# Patient Record
Sex: Male | Born: 1980 | Race: Black or African American | Hispanic: No | Marital: Single | Smoking: Current every day smoker
Health system: Southern US, Community
[De-identification: ages and names within clinical notes are randomized; demographics above are authoritative.]

## PROBLEM LIST (undated history)

## (undated) DIAGNOSIS — A549 Gonococcal infection, unspecified: Secondary | ICD-10-CM

---

## 2011-04-01 ENCOUNTER — Encounter (HOSPITAL_COMMUNITY): Payer: Self-pay

## 2011-04-01 ENCOUNTER — Emergency Department (HOSPITAL_COMMUNITY)
Admission: EM | Admit: 2011-04-01 | Discharge: 2011-04-01 | Disposition: A | Discharge status: Home / Self Care | Payer: Self-pay | Attending: Emergency Medicine | Admitting: Emergency Medicine

## 2011-04-01 ENCOUNTER — Emergency Department (HOSPITAL_COMMUNITY): Payer: Self-pay

## 2011-04-01 DIAGNOSIS — N453 Epididymo-orchitis: Secondary | ICD-10-CM | POA: Insufficient documentation

## 2011-04-01 LAB — URINALYSIS, ROUTINE W REFLEX MICROSCOPIC
Bilirubin Urine: NEGATIVE
Protein, ur: NEGATIVE mg/dL
Urobilinogen, UA: 0.2 mg/dL (ref 0.0–1.0)

## 2011-04-01 LAB — URINE MICROSCOPIC-ADD ON

## 2011-04-01 MED ORDER — HYDROCODONE-ACETAMINOPHEN 5-325 MG PO TABS: 1.0000 | ORAL_TABLET | ORAL | Status: AC | PRN

## 2011-04-01 MED ORDER — CIPROFLOXACIN HCL 500 MG PO TABS: 500.0000 mg | ORAL_TABLET | Freq: Two times a day (BID) | ORAL | Status: AC

## 2011-04-01 MED ORDER — LIDOCAINE HCL (PF) 1 % IJ SOLN
INTRAMUSCULAR | Status: AC
  Filled 2011-04-01: qty 5

## 2011-04-01 MED ORDER — CIPROFLOXACIN HCL 500 MG PO TABS
500.0000 mg | ORAL_TABLET | Freq: Once | ORAL | Status: AC
  Administered 2011-04-01: 500 mg via ORAL
  Filled 2011-04-01: qty 1

## 2011-04-01 MED ORDER — CEFTRIAXONE SODIUM 250 MG IJ SOLR
250.0000 mg | Freq: Once | INTRAMUSCULAR | Status: AC
  Administered 2011-04-01: 250 mg via INTRAMUSCULAR
  Filled 2011-04-01: qty 250

## 2011-04-01 MED ORDER — HYDROCODONE-ACETAMINOPHEN 5-325 MG PO TABS
1.0000 | ORAL_TABLET | Freq: Once | ORAL | Status: AC
  Administered 2011-04-01: 1 via ORAL
  Filled 2011-04-01: qty 1

## 2011-04-01 MED ORDER — AZITHROMYCIN 1 G PO PACK
1.0000 g | PACK | Freq: Once | ORAL | Status: AC
  Administered 2011-04-01: 1 g via ORAL
  Filled 2011-04-01: qty 1

## 2011-04-01 NOTE — Discharge Instructions (Signed)
Epididymitis Epididymitis is a swelling (inflammation) of the epididymis. The epididymis is a cord-like structure along the back part of the testicle. Epididymitis is usually, but not always, caused by infection. This is usually a sudden problem beginning with chills, fever and pain behind the scrotum and in the testicle. There may be swelling and redness of the testicle. DIAGNOSIS  Physical examination will reveal a tender, swollen epididymis. Sometimes, cultures are obtained from the urine or from prostate secretions to help find out if there is an infection or if the cause is a different problem. Sometimes, blood work is performed to see if your white blood cell count is elevated and if a germ (bacterial) or viral infection is present. Using this knowledge, an appropriate medicine which kills germs (antibiotic) can be chosen by your caregiver. A viral infection causing epididymitis will most often go away (resolve) without treatment. HOME CARE INSTRUCTIONS   Hot sitz baths for 20 minutes, 4 times per day, may help relieve pain.   Only take over-the-counter or prescription medicines for pain, discomfort or fever as directed by your caregiver.   Take all medicines, including antibiotics, as directed. Take the antibiotics for the full prescribed length of time even if you are feeling better.   It is very important to keep all follow-up appointments.  SEEK IMMEDIATE MEDICAL CARE IF:   You have a fever.   You have pain not relieved with medicines.   You have any worsening of your problems.   Your pain seems to come and go.   You develop pain, redness, and swelling in the scrotum and surrounding areas.  MAKE SURE YOU:   Understand these instructions.   Will watch your condition.   Will get help right away if you are not doing well or get worse.  Document Released: 12/17/1999 Document Revised: 12/08/2010 Document Reviewed: 11/05/2008 [facility] Patient Information 2012 [place],  [place].Orchitis Orchitis is an infection of the testicle of usually sudden onset (happens quickly). It may be viral or bacterial (caused by germs). Usually with this illness there is generalized malaise (not feeling well) and fever. There is also pain. There is usually tenderness and swelling of the scrotum and testicle. DIAGNOSIS  Your caregiver will perform an exam to make sure there is not another reason for the pain in your testicle. A rectal exam may be done to find out if the prostate is swollen and tender. Blood work may be done to see if your white blood cell count is elevated. This can help determine if an infection is viral or bacterial. A urinalysis can also determine what type of infection is present. Most bacterial infections can be treated with antibiotics (medications which kill germs). LET YOUR CAREGIVER KNOW ABOUT:  Allergies.   Medications taken including herbs, eye drops, over the counter medications, and creams.   Use of steroids (by mouth or creams).   Previous problems with anesthetics or novocaine.   Previous prostate infections.   History of blood clots (thrombophlebitis).   History of bleeding or blood problems.   Previous surgery.   Previous urinary tract infection.   Other health problems.  HOME CARE INSTRUCTIONS   Apply cold packs to the scrotal area for twenty minutes, four times per day or as needed.   A scrotal support may be helpful. Keep a small pillow or support under your testicles while lying or sitting down.   Only take over-the-counter or prescription medicines for pain, discomfort, or fever as directed by your caregiver.  Take all medications, including antibiotics, as directed. Take the antibiotics for the full prescribed length of time even if you are feeling better.  SEEK IMMEDIATE MEDICAL CARE IF:   Your redness, swelling, or pain in the testicle increases or is not getting better.   You have a fever.   You have pain not relieved with  medicines.   You have any worsening of any symptoms (problems) that originally brought you in for medical care.  Document Released: 12/17/1999 Document Revised: 12/08/2010 Document Reviewed: 12/19/2004 [facility] Patient Information 2012 [place], [place].  Follow up with Dr. Margarita Grizzle with urology this coming week.

## 2011-04-01 NOTE — ED Provider Notes (Signed)
History     CSN: [id number]  Arrival date & time 04/01/11  1743   First MD Initiated Contact with Patient 04/01/11 2000      Chief Complaint  Patient presents with  . Testicle Pain    (Consider location/radiation/quality/duration/timing/severity/associated sxs/prior treatment) HPI Comments: Patient here with acute onset of left testicle pain and swelling - states this started about 3 days ago - has been gradually increasing since then.  Denies fever, chills, urethral discharge, penile pain, rectal pain, dysuria, hematuria.  Patient is a 31 y.o. male presenting with testicular pain. The history is provided by the patient. No language interpreter was used.  Testicle Pain This is a new problem. The current episode started in the past 7 days. The problem occurs constantly. The problem has been gradually worsening. Associated symptoms include swollen glands. Pertinent negatives include no abdominal pain, anorexia, arthralgias, change in bowel habit, chest pain, chills, congestion, coughing, diaphoresis, fatigue, fever, headaches, joint swelling, myalgias, nausea, neck pain, numbness, rash, sore throat, urinary symptoms, vertigo, visual change, vomiting or weakness. The symptoms are aggravated by nothing. He has tried nothing for the symptoms. The treatment provided no relief.    History reviewed. No pertinent past medical history.  History reviewed. No pertinent past surgical history.  History reviewed. No pertinent family history.  History  Substance Use Topics  . Smoking status: Current Everyday Smoker  . Smokeless tobacco: Not on file  . Alcohol Use: Yes      Review of Systems  Constitutional: Negative for fever, chills, diaphoresis and fatigue.  HENT: Negative for congestion, sore throat and neck pain.   Respiratory: Negative for cough.   Cardiovascular: Negative for chest pain.  Gastrointestinal: Negative for nausea, vomiting, abdominal pain, anorexia and change in bowel  habit.  Genitourinary: Positive for testicular pain.  Musculoskeletal: Negative for myalgias, joint swelling and arthralgias.  Skin: Negative for rash.  Neurological: Negative for vertigo, weakness, numbness and headaches.  All other systems reviewed and are negative.    Allergies  Review of patient's allergies indicates no known allergies.  Home Medications   Current Outpatient Rx  Name Route Sig Dispense Refill  . ASPIRIN EC 325 MG PO TBEC Oral Take 325 mg by mouth daily as needed. For pain    . RANITIDINE HCL 150 MG PO TABS Oral Take 150 mg by mouth daily as needed. For heartburn      BP 127/71  Pulse 67  Temp(Src) 99.3 F (37.4 C) (Oral)  Resp 22  SpO2 97%  Physical Exam  Nursing note and vitals reviewed. Constitutional: He is oriented to person, place, and time. He appears well-developed and well-nourished. He appears distressed.  HENT:  Head: Normocephalic and atraumatic.  Right Ear: External ear normal.  Left Ear: External ear normal.  Nose: Nose normal.  Mouth/Throat: Oropharynx is clear and moist. No oropharyngeal exudate.  Eyes: Conjunctivae are normal. Pupils are equal, round, and reactive to light. No scleral icterus.  Neck: Normal range of motion. Neck supple.  Cardiovascular: Normal rate, regular rhythm and normal heart sounds.  Exam reveals no gallop and no friction rub.   No murmur heard. Pulmonary/Chest: Effort normal and breath sounds normal. No respiratory distress. He has no wheezes. He has no rales. He exhibits no tenderness.  Abdominal: Soft. Bowel sounds are normal. He exhibits no distension and no mass. There is no tenderness. There is no rebound and no guarding. Hernia confirmed negative in the right inguinal area and confirmed negative in the left inguinal  area.  Genitourinary: Penis normal. Right testis shows no mass, no swelling and no tenderness. Cremasteric reflex is not absent on the right side. Left testis shows swelling and tenderness. Left  testis shows no mass. Cremasteric reflex is not absent on the left side. Circumcised. No penile erythema or penile tenderness. No discharge found.  Musculoskeletal: Normal range of motion. He exhibits no edema and no tenderness.  Lymphadenopathy:    He has no cervical adenopathy.       Right: No inguinal adenopathy present.       Left: Inguinal adenopathy present.  Neurological: He is alert and oriented to person, place, and time. No cranial nerve deficit.  Skin: Skin is warm and dry. No rash noted. No erythema. No pallor.  Psychiatric: He has a normal mood and affect. His behavior is normal. Judgment and thought content normal.    ED Course  Procedures (including critical care time)  Labs Reviewed  URINALYSIS, ROUTINE W REFLEX MICROSCOPIC - Abnormal; Notable for the following:    Hgb urine dipstick TRACE (*)    Leukocytes, UA MODERATE (*)    All other components within normal limits  URINE MICROSCOPIC-ADD ON - Abnormal; Notable for the following:    Squamous Epithelial / LPF FEW (*)    All other components within normal limits  GC/CHLAMYDIA PROBE AMP, GENITAL   US Scrotum  04/01/2011  *RADIOLOGY REPORT*  Clinical Data:  Left testicular pain, swelling.  SCROTAL ULTRASOUND DOPPLER ULTRASOUND OF THE TESTICLES  Technique: Complete ultrasound examination of the testicles, epididymis, and other scrotal structures was performed.  Color and spectral Doppler ultrasound were also utilized to evaluate blood flow to the testicles.  Comparison:  None.  Findings:  Right testis:  4.3 x 2.4 x 2.4 cm. Marked diffuse echogenic foci throughout the testicle compatible with severe microlithiasis. Small cystic areas within the right testicle.  Normal arterial and venous blood flow.  Left testis:  4.1 x 2.9 x 3.3 cm.  Marked diffuse echogenic foci compatible with severe microlithiasis.  Small scattered cystic areas.  Blood flow to the left testicle slightly increased compared to the right suggesting orchitis.   Arterial and venous blood flow documented.  Right epididymis:  Normal in size and appearance.  Left epididymis:  Increase in blood flow to the left epididymis suggesting epididymitis.  Hydocele:  Moderate left  Varicocele:  Small left  Pulsed Doppler interrogation of both testes demonstrates low resistance flow bilaterally.  IMPRESSION: Increased blood flow to the left testicle and epididymis compatible with epididymo-orchitis.  Moderate left hydrocele and small left varicocele.  The severe diffuse bilateral microlithiasis.  This can be associated with increased incidence of testicular cancer.  Consider urologic consultation and sonographic surveillance.  Original Report Authenticated By: Cyndie Chime, M.D.     Left epididymo-orchitis    MDM  Patient without torsion or testicular cancer.  US shows infection but also notes diffuse bilateral microlithiasis and recommends urology follow up which I have discussed with the patient.  Have treated the infection and he will follow up with Dr. Margarita Grizzle.        Noah Edwards [place], [place] 04/01/11 2158

## 2011-04-01 NOTE — ED Notes (Signed)
Lt,. Testicle is swollen for the last 3 days and the pain radiates into his lt. Groin.  Pt. Denies any penile drainage

## 2011-04-03 LAB — GC/CHLAMYDIA PROBE AMP, GENITAL: Chlamydia, DNA Probe: NEGATIVE

## 2011-04-09 NOTE — ED Provider Notes (Signed)
Evaluation and management procedures were performed by the PA/NP/Resident Physician under my supervision/collaboration.   Felisa Bonier, MD 04/09/11 [id number]

## 2011-04-17 ENCOUNTER — Encounter (HOSPITAL_COMMUNITY): Payer: Self-pay

## 2011-04-17 ENCOUNTER — Emergency Department (HOSPITAL_COMMUNITY)
Admission: EM | Admit: 2011-04-17 | Discharge: 2011-04-17 | Disposition: A | Discharge status: Home / Self Care | Payer: Self-pay | Attending: Emergency Medicine | Admitting: Emergency Medicine

## 2011-04-17 DIAGNOSIS — F17200 Nicotine dependence, unspecified, uncomplicated: Secondary | ICD-10-CM | POA: Insufficient documentation

## 2011-04-17 DIAGNOSIS — N50819 Testicular pain, unspecified: Secondary | ICD-10-CM

## 2011-04-17 DIAGNOSIS — N509 Disorder of male genital organs, unspecified: Secondary | ICD-10-CM | POA: Insufficient documentation

## 2011-04-17 MED ORDER — OXYCODONE-ACETAMINOPHEN 5-325 MG PO TABS
2.0000 | ORAL_TABLET | Freq: Once | ORAL | Status: AC
  Administered 2011-04-17: 2 via ORAL
  Filled 2011-04-17: qty 2

## 2011-04-17 MED ORDER — OXYCODONE-ACETAMINOPHEN 5-325 MG PO TABS: 1.0000 | ORAL_TABLET | ORAL | Status: AC | PRN

## 2011-04-17 MED ORDER — DOXYCYCLINE HYCLATE 100 MG PO CAPS: 100.0000 mg | ORAL_CAPSULE | Freq: Two times a day (BID) | ORAL | Status: AC

## 2011-04-17 NOTE — Discharge Instructions (Signed)
PLEASE FOLLOWUP WITH UROLOGY IN ONE WEEK AFTER FINISHING YOUR ANTIBIOTICS AS WE DISCUSSED

## 2011-04-17 NOTE — ED Provider Notes (Signed)
History     CSN: [id number]  Arrival date & time 04/17/11  4098   First MD Initiated Contact with Patient 04/17/11 1104      Chief Complaint  Patient presents with  . Groin Pain     Patient is a 31 y.o. male presenting with groin pain. The history is provided by the patient.  Groin Pain This is a recurrent problem. The current episode started more than 1 week ago. The problem occurs constantly. The problem has not changed since onset.Pertinent negatives include no abdominal pain. The symptoms are aggravated by walking. The symptoms are relieved by rest. Treatments tried: antibiotics. The treatment provided mild relief.  pt reports treated for infection last month, reports some improvement in swelling but still has pain Has not followed up as of yet He has finished doxycycline (10 days) He also reports IM antbiotics at that time No fever/vomiting reported No abd pain  no trauma to his groin  PMH - none  History reviewed. No pertinent past surgical history.  No family history on file.  History  Substance Use Topics  . Smoking status: Current Everyday Smoker  . Smokeless tobacco: Not on file  . Alcohol Use: Yes      Review of Systems  Constitutional: Negative for fever.  Gastrointestinal: Negative for abdominal pain.    Allergies  Review of patient's allergies indicates no known allergies.  Home Medications   Current Outpatient Rx  Name Route Sig Dispense Refill  . HYDROCODONE-ACETAMINOPHEN 5-500 MG PO CAPS Oral Take 1 capsule by mouth every 6 (six) hours as needed. For pain    . DOXYCYCLINE HYCLATE 100 MG PO CAPS Oral Take 1 capsule (100 mg total) by mouth 2 (two) times daily. 14 capsule 0  . OXYCODONE-ACETAMINOPHEN 5-325 MG PO TABS Oral Take 1 tablet by mouth every 4 (four) hours as needed for pain. 10 tablet 0    BP 142/83  Pulse 64  Temp(Src) 98 F (36.7 C) (Oral)  Resp 18  SpO2 99%  Physical Exam CONSTITUTIONAL: Well developed/well nourished HEAD  AND FACE: Normocephalic/atraumatic EYES: EOMI/PERRL ENMT: Mucous membranes moist NECK: supple no meningeal signs SPINE:entire spine nontender CV: S1/S2 noted, no murmurs/rubs/gallops noted LUNGS: Lungs are clear to auscultation bilaterally, no apparent distress ABDOMEN: soft, nontender, no rebound or guarding GU:no cva tenderness.  Left testicle tenderness and mild swelling noted but no erythema/crepitance noted.  Right testicle is nontender.  No hernia noted.  Chaperone present NEURO: Pt is awake/alert, moves all extremitiesx4 EXTREMITIES: pulses normal, full ROM SKIN: warm, color normal PSYCH: no abnormalities of mood noted  ED Course  Procedures    1. Testicle pain       MDM  Nursing notes reviewed and considered in documentation Previous records reviewed and considered   Pt seen at end of march, had [place] that epidimytis, finished 10 days of doxy but still with pain, but improvement swelling On exam, suspect continued infection doubt torsion Will give another 7 days of doxy and advised him of need for urology followup as instructed by scrotal US results  The patient appears reasonably screened and/or stabilized for discharge and I doubt any other medical condition or other [facility] requiring further screening, evaluation, or treatment in the ED at this time prior to discharge.         Joya Gaskins, MD 04/17/11 1248

## 2011-04-17 NOTE — ED Notes (Signed)
Patient present with left groin pain x 3 weeks. Patient was seen a week ago for the same. Patient was tested for STD and never heard back.  Patient reporting continuous pain since. Patient denies flank pain, penile discharge and urinary symptoms

## 2012-12-17 ENCOUNTER — Other Ambulatory Visit: Payer: Self-pay

## 2012-12-23 ENCOUNTER — Inpatient Hospital Stay: Admission: RE | Admit: 2012-12-23 | Payer: Self-pay | Source: Ambulatory Visit

## 2013-02-09 ENCOUNTER — Emergency Department (HOSPITAL_COMMUNITY)
Admission: EM | Admit: 2013-02-09 | Discharge: 2013-02-09 | Disposition: A | Discharge status: Home / Self Care | Payer: Self-pay | Attending: Emergency Medicine | Admitting: Emergency Medicine

## 2013-02-09 ENCOUNTER — Encounter (HOSPITAL_COMMUNITY): Payer: Self-pay | Admitting: Emergency Medicine

## 2013-02-09 ENCOUNTER — Telehealth (HOSPITAL_COMMUNITY): Payer: Self-pay | Admitting: *Deleted

## 2013-02-09 ENCOUNTER — Emergency Department (HOSPITAL_COMMUNITY): Payer: Self-pay

## 2013-02-09 DIAGNOSIS — N451 Epididymitis: Secondary | ICD-10-CM

## 2013-02-09 DIAGNOSIS — F17200 Nicotine dependence, unspecified, uncomplicated: Secondary | ICD-10-CM | POA: Insufficient documentation

## 2013-02-09 DIAGNOSIS — N453 Epididymo-orchitis: Secondary | ICD-10-CM | POA: Insufficient documentation

## 2013-02-09 DIAGNOSIS — N509 Disorder of male genital organs, unspecified: Secondary | ICD-10-CM | POA: Insufficient documentation

## 2013-02-09 DIAGNOSIS — N6320 Unspecified lump in the left breast, unspecified quadrant: Secondary | ICD-10-CM

## 2013-02-09 DIAGNOSIS — A5400 Gonococcal infection of lower genitourinary tract, unspecified: Secondary | ICD-10-CM | POA: Insufficient documentation

## 2013-02-09 DIAGNOSIS — R369 Urethral discharge, unspecified: Secondary | ICD-10-CM | POA: Insufficient documentation

## 2013-02-09 DIAGNOSIS — N63 Unspecified lump in unspecified breast: Secondary | ICD-10-CM | POA: Insufficient documentation

## 2013-02-09 HISTORY — DX: Gonococcal infection, unspecified: A54.9

## 2013-02-09 LAB — URINE MICROSCOPIC-ADD ON

## 2013-02-09 LAB — URINALYSIS, ROUTINE W REFLEX MICROSCOPIC
BILIRUBIN URINE: NEGATIVE
GLUCOSE, UA: NEGATIVE mg/dL
Ketones, ur: NEGATIVE mg/dL
Nitrite: NEGATIVE
PROTEIN: NEGATIVE mg/dL
Specific Gravity, Urine: 1.015 (ref 1.005–1.030)
Urobilinogen, UA: 1 mg/dL (ref 0.0–1.0)
pH: 6 (ref 5.0–8.0)

## 2013-02-09 MED ORDER — CEFTRIAXONE SODIUM 250 MG IJ SOLR
250.0000 mg | Freq: Once | INTRAMUSCULAR | Status: AC
  Administered 2013-02-09: 250 mg via INTRAMUSCULAR
  Filled 2013-02-09: qty 250

## 2013-02-09 MED ORDER — HYDROCODONE-ACETAMINOPHEN 5-325 MG PO TABS
1.0000 | ORAL_TABLET | Freq: Once | ORAL | Status: AC
  Administered 2013-02-09: 1 via ORAL
  Filled 2013-02-09: qty 1

## 2013-02-09 MED ORDER — AZITHROMYCIN 250 MG PO TABS
1000.0000 mg | ORAL_TABLET | Freq: Once | ORAL | Status: AC
  Administered 2013-02-09: 1000 mg via ORAL
  Filled 2013-02-09: qty 4

## 2013-02-09 MED ORDER — DOXYCYCLINE HYCLATE 100 MG PO TABS
100.0000 mg | ORAL_TABLET | Freq: Once | ORAL | Status: AC
  Administered 2013-02-09: 100 mg via ORAL
  Filled 2013-02-09: qty 1

## 2013-02-09 MED ORDER — HYDROCODONE-ACETAMINOPHEN 5-325 MG PO TABS: 1.0000 | ORAL_TABLET | Freq: Four times a day (QID) | ORAL | Status: AC | PRN

## 2013-02-09 MED ORDER — DOXYCYCLINE HYCLATE 100 MG PO CAPS: 100.0000 mg | ORAL_CAPSULE | Freq: Two times a day (BID) | ORAL | Status: AC

## 2013-02-09 MED ORDER — LIDOCAINE HCL (PF) 1 % IJ SOLN
INTRAMUSCULAR | Status: AC
  Administered 2013-02-09: 5 mL
  Filled 2013-02-09: qty 5

## 2013-02-09 MED ORDER — NAPROXEN 500 MG PO TABS: 500.0000 mg | ORAL_TABLET | Freq: Two times a day (BID) | ORAL | Status: AC

## 2013-02-09 NOTE — ED Notes (Signed)
Pt called to get me to call rxs into [facility] on [address],  Called in Doxycycline and Naproxen, pt instructed he would have to go back through the whole process since he lost his rxs on the bus.

## 2013-02-09 NOTE — ED Notes (Signed)
Pt presents to department for evaluation of abdominal pain, R testicular pain and green/yellow penile discharge. Ongoing x1 week. Pt is alert and oriented x4. No signs of acute distress noted.

## 2013-02-09 NOTE — Discharge Instructions (Signed)
Take naprosyn for pain and inflammation. norco for severe pain only. Take antibiotics as prescribed until all gone. Follow up with urology.     Epididymitis Epididymitis is a swelling (inflammation) of the epididymis. The epididymis is a cord-like structure along the back part of the testicle. Epididymitis is usually, but not always, caused by infection. This is usually a sudden problem beginning with chills, fever and pain behind the scrotum and in the testicle. There may be swelling and redness of the testicle. DIAGNOSIS  Physical examination will reveal a tender, swollen epididymis. Sometimes, cultures are obtained from the urine or from prostate secretions to help find out if there is an infection or if the cause is a different problem. Sometimes, blood work is performed to see if your white blood cell count is elevated and if a germ (bacterial) or viral infection is present. Using this knowledge, an appropriate medicine which kills germs (antibiotic) can be chosen by your caregiver. A viral infection causing epididymitis will most often go away (resolve) without treatment. HOME CARE INSTRUCTIONS   Hot sitz baths for 20 minutes, 4 times per day, may help relieve pain.  Only take over-the-counter or prescription medicines for pain, discomfort or fever as directed by your caregiver.  Take all medicines, including antibiotics, as directed. Take the antibiotics for the full prescribed length of time even if you are feeling better.  It is very important to keep all follow-up appointments. SEEK IMMEDIATE MEDICAL CARE IF:   You have a fever.  You have pain not relieved with medicines.  You have any worsening of your problems.  Your pain seems to come and go.  You develop pain, redness, and swelling in the scrotum and surrounding areas. MAKE SURE YOU:   Understand these instructions.  Will watch your condition.  Will get help right away if you are not doing well or get worse. Document  Released: 12/17/1999 Document Revised: 03/13/2011 Document Reviewed: 11/05/2008 [facility] Patient Information 2014 [place], [place].

## 2013-02-09 NOTE — ED Notes (Signed)
Patient transported to Ultrasound 

## 2013-02-09 NOTE — ED Provider Notes (Signed)
CSN: [id number]     Arrival date & time 02/09/13  1206 History  This chart was scribed for Noah Crumble, PA, working with Noah Kras, MD, by Noah Edwards, ED Scribe. This patient was seen in room TR10C/TR10C and the patient's care was started at 1:22 PM.   Chief Complaint  Patient presents with  . Abdominal Pain  . Testicle Pain  . Penile Discharge   The history is provided by the patient. No language interpreter was used.    HPI Comments: Noah Edwards is a 33 y.o. male who presents to the Emergency Department complaining of testicular pain with onset 2 days ago. Patient reports some swelling to one testicle and is having associated pain in his scrotum. He denies dysuria; however, reports a mild burning sensation while urinating. Patient reports that one week ago, he noticed penile discharge of a milky color. Patient had a similar episode 15 years ago and was dx with ghonorrhia. Patient denies any recent sexual activity. Patient denies having a fever.   Patient also reports having pain in his L breast and noticing a small swelling near his L nipple, with onset last December. He reports the sensation began as a tingling sensation and has progressed to a mild pain, particularly with touch. Patient denies having similar symptoms in the past.   Past Medical History  Diagnosis Date  . Gonorrhea    History reviewed. No pertinent past surgical history. History reviewed. No pertinent family history. History  Substance Use Topics  . Smoking status: Current Every Day Smoker    Types: Cigarettes  . Smokeless tobacco: Not on file  . Alcohol Use: Yes    Review of Systems  Constitutional: Negative for fever and chills.  HENT: Negative for congestion, sore throat and trouble swallowing.   Respiratory: Negative for cough and shortness of breath.   Gastrointestinal: Negative for nausea, vomiting and diarrhea.  Genitourinary: Positive for discharge, scrotal swelling and testicular pain.  Negative for dysuria and hematuria.  Skin:       Small and mildly painful swelling to L breast.  Neurological: Negative for weakness and headaches.  All other systems reviewed and are negative.    Allergies  Review of patient's allergies indicates no known allergies.  Home Medications   Current Outpatient Rx  Name  Route  Sig  Dispense  Refill  . hydrocodone-acetaminophen (LORCET-HD) 5-500 MG per capsule   Oral   Take 1 capsule by mouth every 6 (six) hours as needed. For pain          Triage Vitals: BP 120/71  Pulse 75  Temp(Src) 97.8 F (36.6 C) (Oral)  Resp 20  Ht 5\' 11"  (1.803 m)  Wt 160 lb (72.576 kg)  BMI 22.33 kg/m2  SpO2 100%  Physical Exam  Nursing note and vitals reviewed. Constitutional: He is oriented to person, place, and time. He appears well-developed and well-nourished. No distress.  HENT:  Head: Normocephalic and atraumatic.  Eyes: EOM are normal.  Neck: Neck supple. No tracheal deviation present.  Cardiovascular: Normal rate.   Pulmonary/Chest: Effort normal. No respiratory distress.  Genitourinary:  Right testicle enlarged. Tender to palpation. Normal cremasteric reflex. Penis normal  Musculoskeletal: Normal range of motion.  Neurological: He is alert and oriented to person, place, and time.  Skin: Skin is warm and dry.  1 cm soft, mass directly underneath the L nipple. TTP. No nipple discharge.  Psychiatric: He has a normal mood and affect. His behavior is normal.  ED Course  Procedures (including critical care time)  DIAGNOSTIC STUDIES: Oxygen Saturation is 100% on room air, normal by my interpretation.    COORDINATION OF CARE: 1:25 PM-UTS of scrotum and swab for ghonorrhia ordered. Discussed my suspcision of a mammary gland in L breast as cause of symptoms. Referred to breast center to get an UTS.Treatment plan discussed with patient and patient agrees.  Labs Review Labs Reviewed  URINALYSIS, ROUTINE W REFLEX MICROSCOPIC - Abnormal;  Notable for the following:    Hgb urine dipstick SMALL (*)    Leukocytes, UA MODERATE (*)    All other components within normal limits  URINE MICROSCOPIC-ADD ON - Abnormal; Notable for the following:    Bacteria, UA FEW (*)    All other components within normal limits  URINE CULTURE  GC/CHLAMYDIA PROBE AMP   Imaging Review [place] Scrotum  02/09/2013   CLINICAL DATA:  Testicular pain.  EXAM: SCROTAL ULTRASOUND  DOPPLER ULTRASOUND OF THE TESTICLES  TECHNIQUE: Complete ultrasound examination of the testicles, epididymis, and other scrotal structures was performed. Color and spectral Doppler ultrasound were also utilized to evaluate blood flow to the testicles.  COMPARISON:  04/01/2011  FINDINGS: Right testicle  Measurements: 4.5 x 2.4 x 3.1 cm. No mass. Normal arterial and venous blood flow documented. Extensive calcifications, similar to prior study.  Left testicle  Measurements: 4.4 x 2.9 x 3.0 cm. No mass. Normal arterial venous blood flow documented. Extensive microcalcifications, similar to prior study.  Right epididymis: Enlarged with increased blood flow compatible with epididymitis.  Left epididymis:  Normal in size and appearance.  Hydrocele:  Small bilateral  Varicocele:  None visualized.  Pulsed Doppler interrogation of both testes demonstrates low resistance arterial and venous waveforms bilaterally.  IMPRESSION: Extensive bilateral microlithiasis, unchanged since prior study.  Enlarged, hyperemic right epididymis compatible with epididymitis.  Small bilateral hydroceles.   Electronically Signed   By: Charlett NoseKevin  Dover M.D.   On: 02/09/2013 16:21   [place] Art/ven Flow Abd Pelv Doppler  02/09/2013   CLINICAL DATA:  Testicular pain.  EXAM: SCROTAL ULTRASOUND  DOPPLER ULTRASOUND OF THE TESTICLES  TECHNIQUE: Complete ultrasound examination of the testicles, epididymis, and other scrotal structures was performed. Color and spectral Doppler ultrasound were also utilized to evaluate blood flow to the testicles.   COMPARISON:  04/01/2011  FINDINGS: Right testicle  Measurements: 4.5 x 2.4 x 3.1 cm. No mass. Normal arterial and venous blood flow documented. Extensive calcifications, similar to prior study.  Left testicle  Measurements: 4.4 x 2.9 x 3.0 cm. No mass. Normal arterial venous blood flow documented. Extensive microcalcifications, similar to prior study.  Right epididymis: Enlarged with increased blood flow compatible with epididymitis.  Left epididymis:  Normal in size and appearance.  Hydrocele:  Small bilateral  Varicocele:  None visualized.  Pulsed Doppler interrogation of both testes demonstrates low resistance arterial and venous waveforms bilaterally.  IMPRESSION: Extensive bilateral microlithiasis, unchanged since prior study.  Enlarged, hyperemic right epididymis compatible with epididymitis.  Small bilateral hydroceles.   Electronically Signed   By: Charlett NoseKevin  Dover M.D.   On: 02/09/2013 16:21    EKG Interpretation   None       MDM   1. Epididymitis, right   2. Breast mass, left  Pt is in emergency department with penile discharge and right scrotal pain and swelling. Patient treated in emergency department and Rocephin Zithromax. His GC and Chlamydia culture sent. I did get an ultrasound of her testes which showed unchanged bilateral microlithiasis and right  epididymitis. Patient denies any history of anal intercourse. Patient will be discharged home with doxycycline for 10 days. He is instructed to make sure he takes to all the medication. Norco for pain. He is also instructed to followup with the breast Center or primary care doctor regarding his breast mass. This time I do not think this is cancer is given it is tender, mobile, soft.   Filed Vitals:   02/09/13 1207 02/09/13 1430 02/09/13 1714  BP: 120/71 104/68 113/66  Pulse: 75 67 77  Temp: 97.8 F (36.6 C)    TempSrc: Oral    Resp: 20 18 20   Height: 5\' 11"  (1.803 m)    Weight: 160 lb (72.576 kg)    SpO2: 100% 100% 100%    I  personally performed the services described in this documentation, which was scribed in my presence. The recorded information has been reviewed and is accurate.    Lottie Mussel, PA-C 02/09/13 1732

## 2013-02-10 LAB — GC/CHLAMYDIA PROBE AMP
CT Probe RNA: NEGATIVE
GC PROBE AMP APTIMA: NEGATIVE

## 2013-02-10 LAB — URINE CULTURE

## 2013-02-10 NOTE — Progress Notes (Signed)
Incoming call from Patient.He reports [facility] and was prescribed Doxycycline.Patient reports he has no health Insurance and requests help . This CM explained her role and options avaliable to patient.MATCH program explained.Patient educated this is once a year help ( up to $400 dollars) and that patient would need to Make a $3 dollar co-pay and use one of the [facility].Patient is aware Pain medication is not covered by the  [facility] .Patient  Was  Educated on Good Rx.This CM explained another option for the patient would be a Coupon-Per Good Rx his medication today would cost $17 .Patient elects to use his once a year  [facility].Patient reports he will return to ED to collect his MATCH letter from the main check in desk in ED.Patient receptive to CM help today.No further CM  Needs Identified.

## 2013-02-10 NOTE — Progress Notes (Signed)
Case Manager attempted to call patient at home he requested Medication assistance for his Doxycycline.Voicemail option not set up.Will attempt to call patient again this morning.

## 2013-02-10 NOTE — Progress Notes (Signed)
ED Care Management Note Date initiated:         Documentation initiated by:  ...        Subjective/Objective Assessment:  MATCH MEDICATION ASSISTANCE.             Subjective/Objective Assessment Detail:             Action/Plan:             Action/Plan Detail:  Incoming call from patient.Demographics verified in EPIC.  ED service date 02/09/13             Anticipated DC Date:         Status Recommendation to Physician:  ...      Result of Recommendation:  ...       Other ED Services:    Marland Kitchen...               In-house referral:    ...             DC Planning Services:    Marland Kitchen...              PAC Choice:    ...             Choice offered to / List presented to:  ...       DME arranged:    ...             DME agency:    ...              HH arranged:    ...             HH agency:    ...              Status of service:  ...        ED Comments:  MATCH.          ED Comment Detail:  CM received incoming call from patient regarding Medication help.Patient is uninsured.( see EPIC note)  Patient educated on Good RX and MATCH program.[place] explained.Patient aware this assistance  Is only once yearly and patient aware he has a Geneticist, molecular$3 dollar co-pay.Patient has not used the program before  And is aware Pain Medications are not covered per [facility] Guidelines.Teach back method used to ensure  Patient understanding of todays plan.

## 2013-02-11 NOTE — ED Provider Notes (Signed)
Medical screening examination/treatment/procedure(s) were performed by non-physician practitioner and as supervising physician I was immediately available for consultation/collaboration.    Rosabella Edgin R Giabella Duhart, MD 02/11/13 1110 

## 2015-01-31 IMAGING — US US ART/VEN ABD/PELV/SCROTUM DOPPLER LTD
1 series · 14 of 25 positions shown · non-contrast
Comparison: 04/01/2011

CLINICAL DATA: Testicular pain.

EXAM:
SCROTAL ULTRASOUND
DOPPLER ULTRASOUND OF THE TESTICLES
TECHNIQUE: Complete ultrasound examination of the testicles, epididymis, and
other scrotal structures was performed. Color and spectral Doppler
ultrasound were also utilized to evaluate blood flow to the
testicles.

[Series 1: us art/ven abd/pelv/scrotum doppler ltd · 0.07mm/px · 14 of 36 slices shown]
[im 1/36]
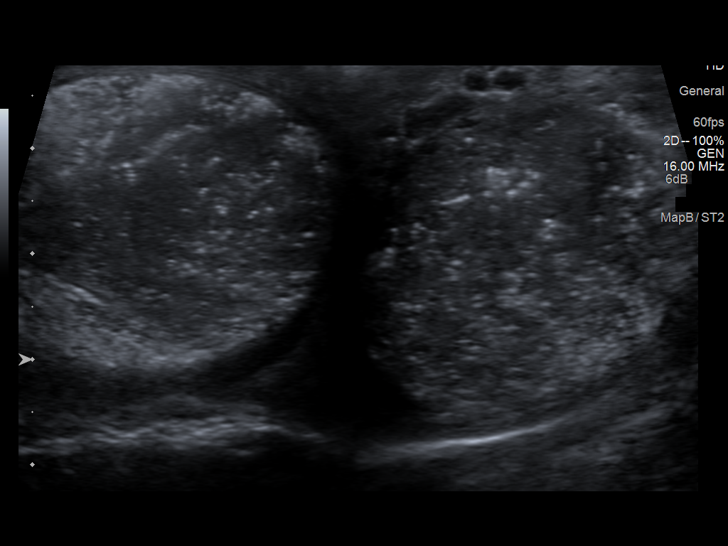
[im 3/36]
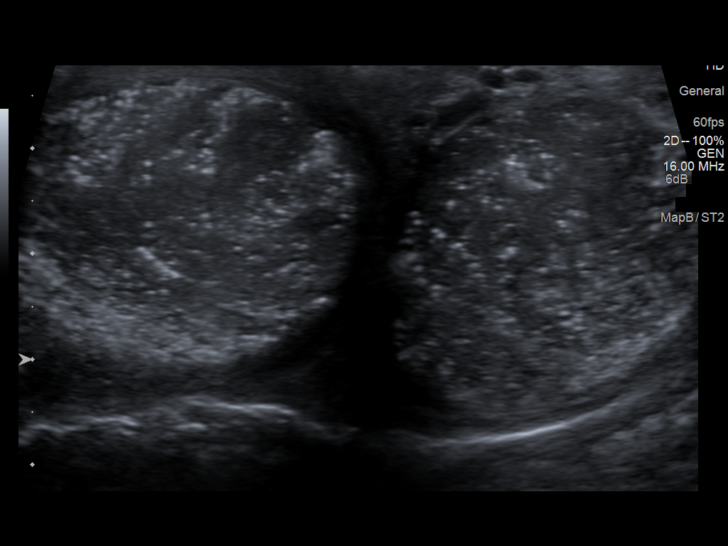
[im 6/36]
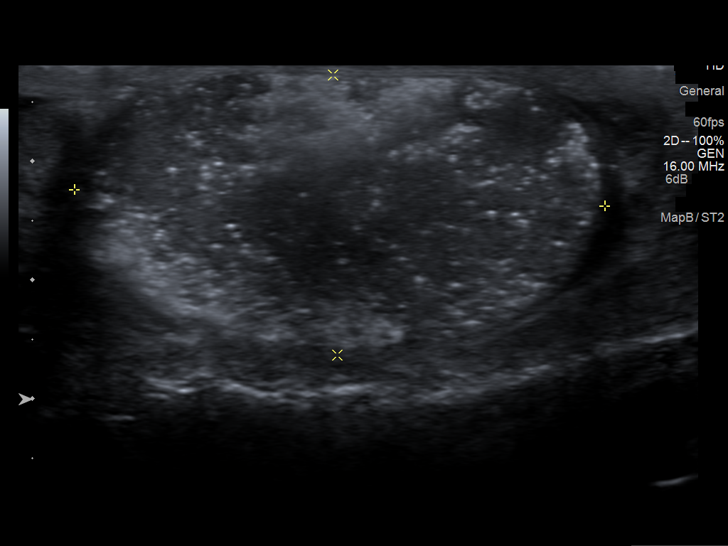
[im 9/36]
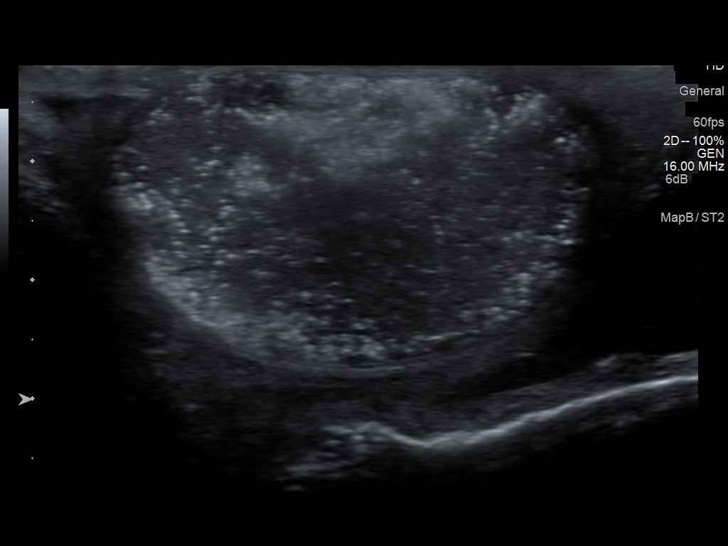
[im 12/36]
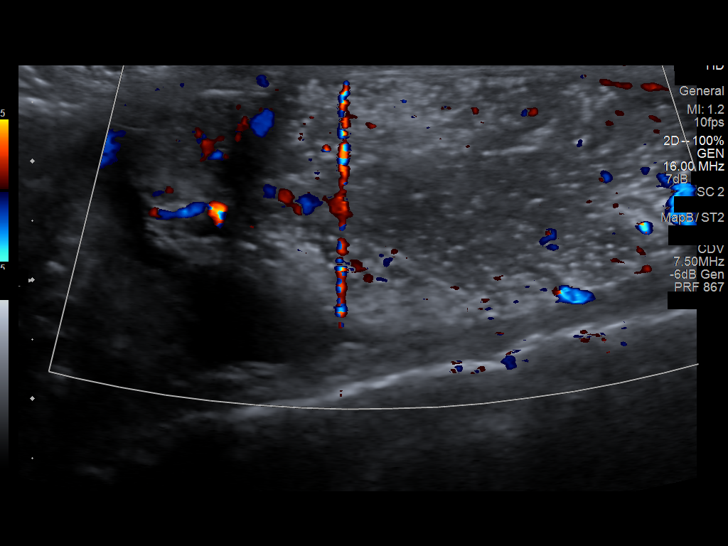
[im 14/36]
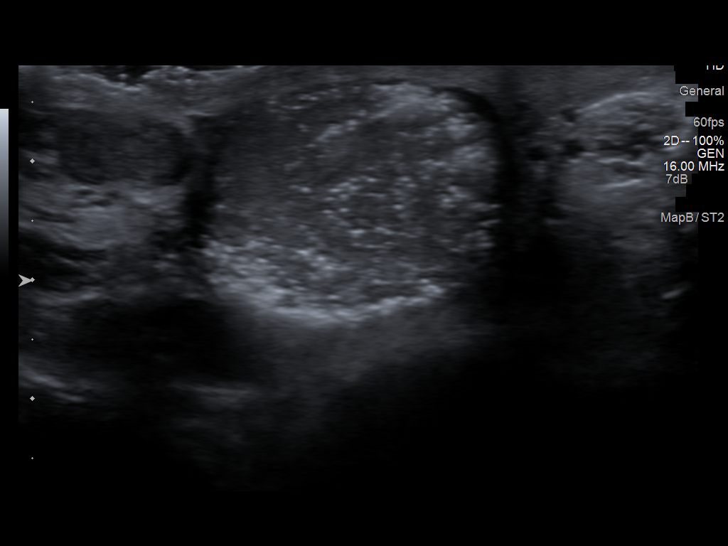
[im 17/36]
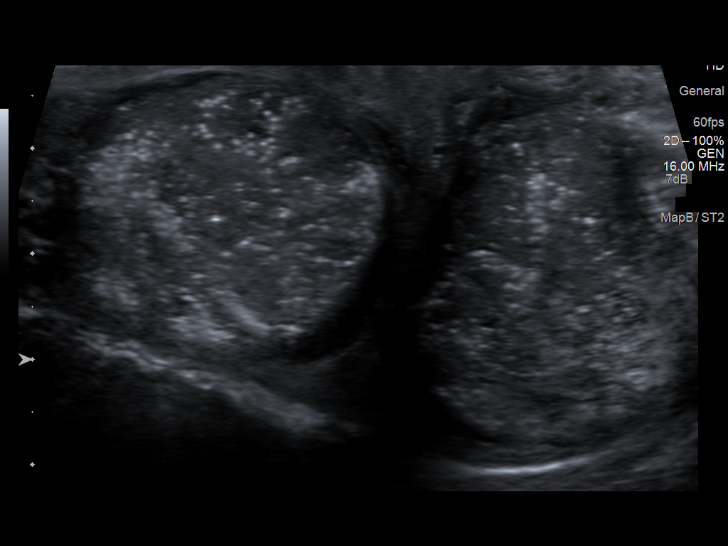
[im 19/36]
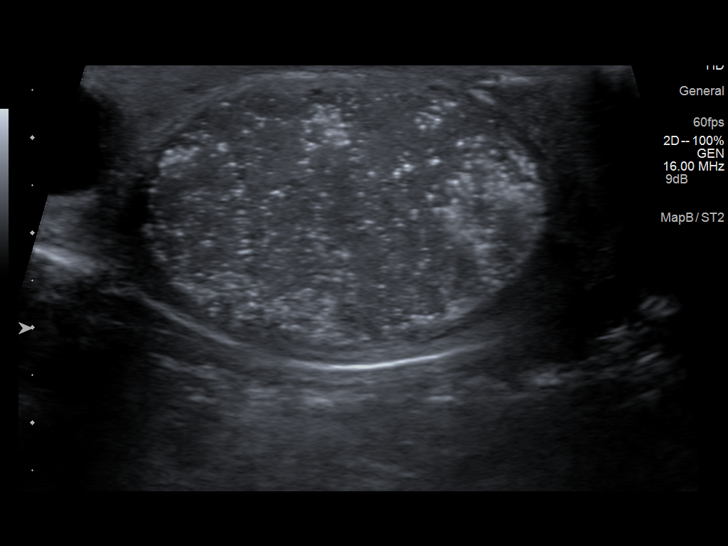
[im 22/36]
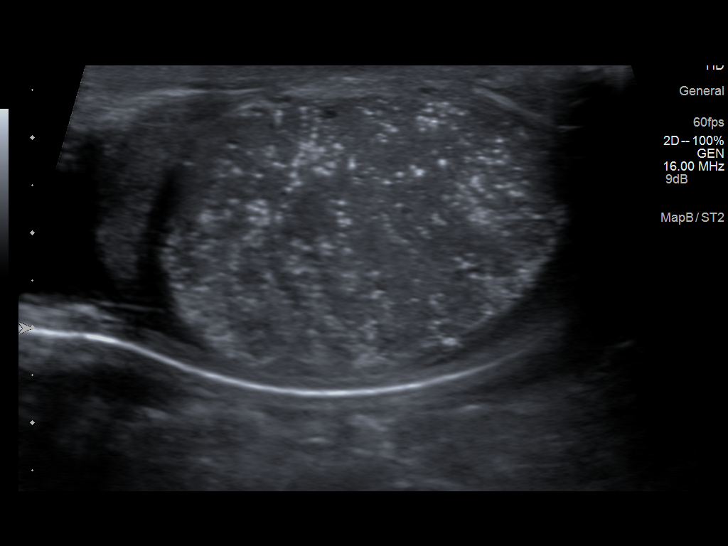
[im 24/36]
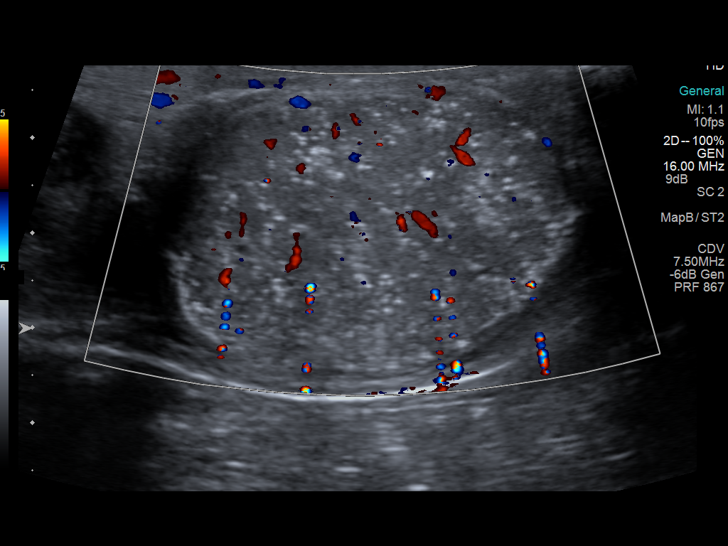
[im 27/36]
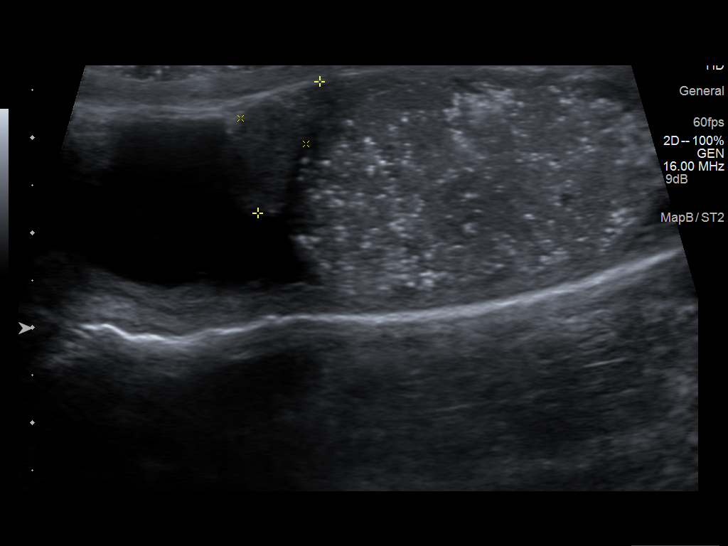
[im 30/36]
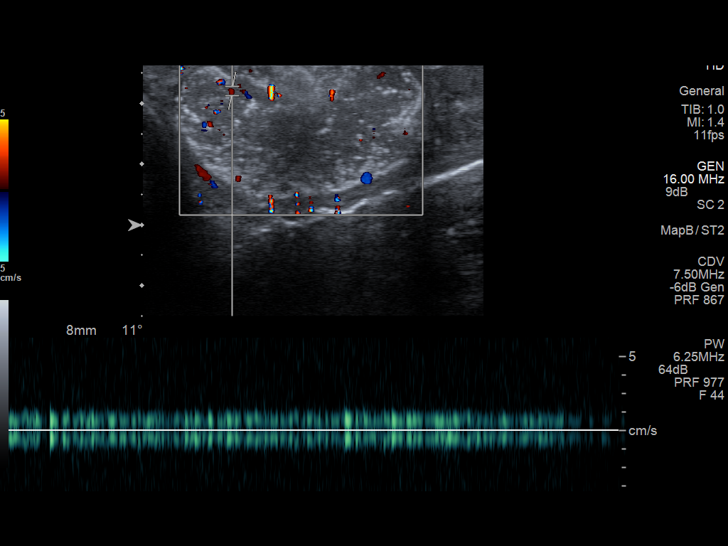
[im 33/36]
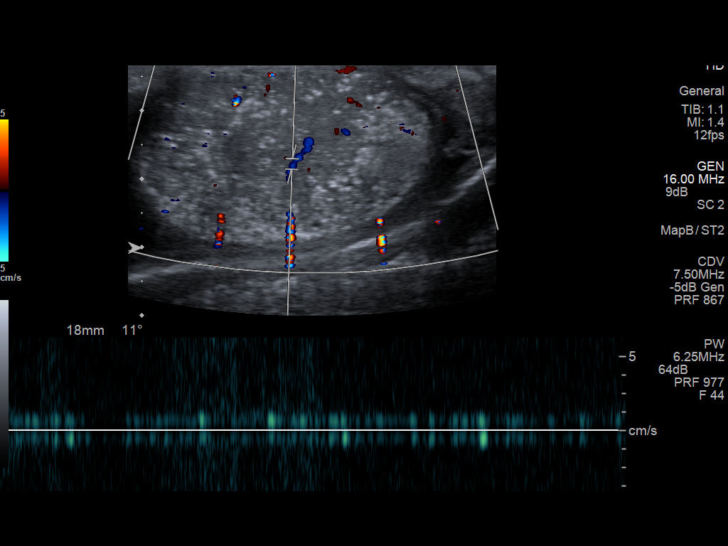
[im 36/36]
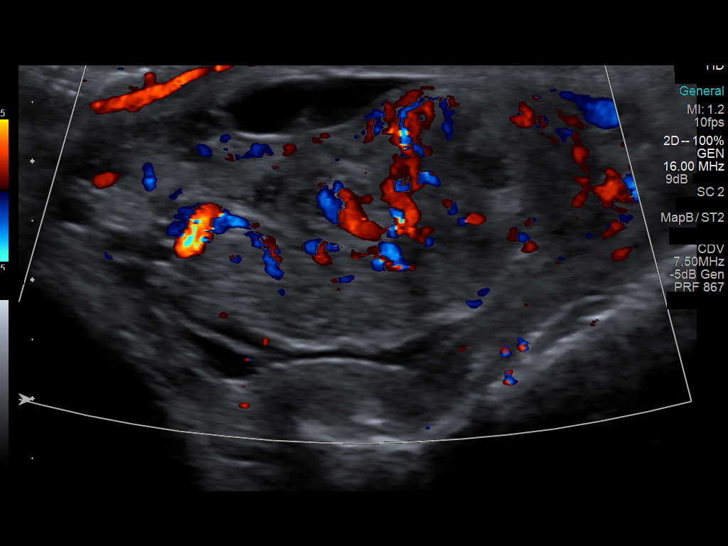

[14 of 25 positions shown; findings below may reference images not displayed]

FINDINGS: Right testicle

Measurements: 4.5 x 2.4 x 3.1 cm. No mass. Normal arterial and
venous blood flow documented. Extensive calcifications, similar to
prior study.

Left testicle

Measurements: 4.4 x 2.9 x 3.0 cm. No mass. Normal arterial venous
blood flow documented. Extensive microcalcifications, similar to
prior study.

Right epididymis: Enlarged with increased blood flow compatible with
epididymitis.

Left epididymis:  Normal in size and appearance.

Hydrocele:  Small bilateral

Varicocele:  None visualized.

Pulsed Doppler interrogation of both testes demonstrates low
resistance arterial and venous waveforms bilaterally.
IMPRESSION: Extensive bilateral microlithiasis, unchanged since prior study.

Enlarged, hyperemic right epididymis compatible with epididymitis.

Small bilateral hydroceles.

## 2021-12-22 ENCOUNTER — Encounter: Payer: Self-pay | Admitting: Physician Assistant

## 2021-12-22 ENCOUNTER — Ambulatory Visit: Payer: Self-pay | Admitting: Physician Assistant

## 2021-12-22 VITALS — BP 110/58 | HR 108 | Ht 71.0 in | Wt 147.0 lb

## 2021-12-22 DIAGNOSIS — J208 Acute bronchitis due to other specified organisms: Secondary | ICD-10-CM

## 2021-12-22 DIAGNOSIS — B9689 Other specified bacterial agents as the cause of diseases classified elsewhere: Secondary | ICD-10-CM

## 2021-12-22 DIAGNOSIS — R000 Tachycardia, unspecified: Secondary | ICD-10-CM

## 2021-12-22 MED ORDER — AZITHROMYCIN 250 MG PO TABS: ORAL_TABLET | ORAL | 0 refills | Status: AC

## 2021-12-22 MED ORDER — PREDNISONE 20 MG PO TABS: ORAL_TABLET | ORAL | 0 refills | Status: AC

## 2021-12-22 NOTE — Patient Instructions (Signed)
You are going to take azithromycin as directed, and a prednisone taper.  I encourage you to stay well-hydrated and get plenty of rest.  I do encourage you to return to the mobile unit when we are back in this area on January 4 for follow-up.  Roney Jaffe, PA-C Physician Assistant [facility] [email or web address]   Acute Bronchitis, Adult  Acute bronchitis is sudden inflammation of the main airways (bronchi) that come off the windpipe (trachea) in the lungs. The swelling causes the airways to get smaller and make more mucus than normal. This can make it hard to breathe and can cause coughing or noisy breathing (wheezing). Acute bronchitis may last several weeks. The cough may last longer. Allergies, asthma, and exposure to smoke may make the condition worse. What are the causes? This condition can be caused by germs and by substances that irritate the lungs, including: Cold and flu viruses. The most common cause of this condition is the virus that causes the common cold. Bacteria. This is less common. Breathing in substances that irritate the lungs, including: Smoke from cigarettes and other forms of tobacco. Dust and pollen. Fumes from household cleaning products, gases, or burned fuel. Indoor or outdoor air pollution. What increases the risk? The following factors may make you more likely to develop this condition: A weak body's defense system, also called the immune system. A condition that affects your lungs and breathing, such as asthma. What are the signs or symptoms? Common symptoms of this condition include: Coughing. This may bring up clear, yellow, or green mucus from your lungs (sputum). Wheezing. Runny or stuffy nose. Having too much mucus in your lungs (chest congestion). Shortness of breath. Aches and pains, including sore throat or chest. How is this diagnosed? This condition is usually diagnosed based  on: Your symptoms and medical history. A physical exam. You may also have other tests, including tests to rule out other conditions, such as pneumonia. These tests include: A test of lung function. Test of a mucus sample to look for the presence of bacteria. Tests to check the oxygen level in your blood. Blood tests. Chest X-ray. How is this treated? Most cases of acute bronchitis clear up over time without treatment. Your health care provider may recommend: Drinking more fluids to help thin your mucus so it is easier to cough up. Taking inhaled medicine (inhaler) to improve air flow in and out of your lungs. Using a vaporizer or a humidifier. These are machines that add water to the air to help you breathe better. Taking a medicine that thins mucus and clears congestion (expectorant). Taking a medicine that prevents or stops coughing (cough suppressant). It is not common to take an antibiotic medicine for this condition. Follow these instructions at home:  Take over-the-counter and prescription medicines only as told by your health care provider. Use an inhaler, vaporizer, or humidifier as told by your health care provider. Take two teaspoons (10 mL) of honey at bedtime to lessen coughing at night. Drink enough fluid to keep your urine pale yellow. Do not use any products that contain nicotine or tobacco. These products include cigarettes, chewing tobacco, and vaping devices, such as e-cigarettes. If you need help quitting, ask your health care provider. Get plenty of rest. Return to your normal activities as told by your health care provider. Ask your health care provider what activities are safe for you. Keep all follow-up visits. This is important. How is this prevented? To lower your risk of  getting this condition again: Wash your hands often with soap and water for at least 20 seconds. If soap and water are not available, use hand sanitizer. Avoid contact with people who have cold  symptoms. Try not to touch your mouth, nose, or eyes with your hands. Avoid breathing in smoke or chemical fumes. Breathing smoke or chemical fumes will make your condition worse. Get the flu shot every year. Contact a health care provider if: Your symptoms do not improve after 2 weeks. You have trouble coughing up the mucus. Your cough keeps you awake at night. You have a fever. Get help right away if you: Cough up blood. Feel pain in your chest. Have severe shortness of breath. Faint or keep feeling like you are going to faint. Have a severe headache. Have a fever or chills that get worse. These symptoms may represent a serious problem that is an emergency. Do not wait to see if the symptoms will go away. Get medical help right away. Call your local emergency services (911 in the [place] Do not drive yourself to the hospital. Summary Acute bronchitis is inflammation of the main airways (bronchi) that come off the windpipe (trachea) in the lungs. The swelling causes the airways to get smaller and make more mucus than normal. Drinking more fluids can help thin your mucus so it is easier to cough up. Take over-the-counter and prescription medicines only as told by your health care provider. Do not use any products that contain nicotine or tobacco. These products include cigarettes, chewing tobacco, and vaping devices, such as e-cigarettes. If you need help quitting, ask your health care provider. Contact a health care provider if your symptoms do not improve after 2 weeks. This information is not intended to replace advice given to you by your health care provider. Make sure you discuss any questions you have with your health care provider. Document Revised: 03/31/2021 Document Reviewed: 04/21/2020 Elsevier Patient Education  2023 [employer].

## 2021-12-22 NOTE — Progress Notes (Signed)
New Patient Office Visit  Subjective    Patient ID: Noah Edwards, male    DOB: [birth year]  Age: 41 y.o. MRN: [id number]  CC:  Chief Complaint  Patient presents with   Pneumonia    HPI Noah Edwards states that he has been having a productive cough with green sputum and green nasal discharge, frontal headache, shortness of breath, wheezing, generalized fatigue, and bodyaches for the past week.  States that he has been using DayQuil without much relief.  States that he last used DayQuil 2 days ago.  States that the cough is keeping him awake at night.  States that he is eating and drinking okay, states that that did improve 2 days ago.  Denies sick contacts, no home COVID test, no previous COVID vaccines.  Denies previous history of tachycardia, does endorse having to walk to mobile unit, did drink alcoholic beverage approximately 20 minutes ago.     Outpatient Encounter Medications as of 12/22/2021  Medication Sig   azithromycin (ZITHROMAX) 250 MG tablet Take 2 tabs PO day 1, then take 1 tab PO once daily   predniSONE (DELTASONE) 20 MG tablet Take 3 tablets (60 mg total) by mouth daily with breakfast for 2 days, THEN 2 tablets (40 mg total) daily with breakfast for 2 days, THEN 1 tablet (20 mg total) daily with breakfast for 2 days, THEN 0.5 tablets (10 mg total) daily with breakfast for 2 days.   doxycycline (VIBRAMYCIN) 100 MG capsule Take 1 capsule (100 mg total) by mouth 2 (two) times daily. (Patient not taking: Reported on 12/22/2021)   HYDROcodone-acetaminophen (NORCO) 5-325 MG per tablet Take 1 tablet by mouth every 6 (six) hours as needed for moderate pain. (Patient not taking: Reported on 12/22/2021)   naproxen (NAPROSYN) 500 MG tablet Take 1 tablet (500 mg total) by mouth 2 (two) times daily. (Patient not taking: Reported on 12/22/2021)   No facility-administered encounter medications on file as of 12/22/2021.    Past Medical History:  Diagnosis Date   Gonorrhea      History reviewed. No pertinent surgical history.  History reviewed. No pertinent family history.  Social History   Socioeconomic History   Marital status: Single    Spouse name: Not on file   Number of children: Not on file   Years of education: Not on file   Highest education level: Not on file  Occupational History   Not on file  Tobacco Use   Smoking status: Every Day    Types: Cigarettes   Smokeless tobacco: Not on file  Substance and Sexual Activity   Alcohol use: Yes   Drug use: No   Sexual activity: Yes    Birth control/protection: Condom  Other Topics Concern   Not on file  Social History Narrative   Not on file   Social Determinants of Health   Financial Resource Strain: Not on file  Food Insecurity: Not on file  Transportation Needs: Not on file  Physical Activity: Not on file  Stress: Not on file  Social Connections: Not on file  Intimate Partner Violence: Not on file    Review of Systems  Constitutional:  Positive for malaise/fatigue. Negative for chills and fever.  HENT:  Positive for congestion. Negative for ear pain, sinus pain and sore throat.   Eyes: Negative.   Respiratory:  Positive for cough, sputum production, shortness of breath and wheezing.   Cardiovascular:  Negative for chest pain.  Gastrointestinal:  Negative for abdominal pain, nausea and  vomiting.  Genitourinary:  Negative for dysuria.  Musculoskeletal:  Positive for myalgias.  Skin: Negative.   Neurological:  Positive for headaches.  Endo/Heme/Allergies: Negative.   Psychiatric/Behavioral: Negative.          Objective    BP (!) 110/58 (BP Location: Left Arm, Patient Position: Sitting, Cuff Size: Small)   Pulse (!) 108   Ht 5\' 11"  (1.803 m)   Wt 147 lb (66.7 kg)   SpO2 97%   BMI 20.50 kg/m   Physical Exam Vitals and nursing note reviewed.  Constitutional:      General: He is not in acute distress.    Appearance: Normal appearance.  HENT:     Head:  Normocephalic and atraumatic.     Salivary Glands: Right salivary gland is diffusely enlarged and tender. Left salivary gland is diffusely enlarged and tender.     Right Ear: Tympanic membrane, ear canal and external ear normal.     Left Ear: Tympanic membrane, ear canal and external ear normal.     Nose:     Right Turbinates: Enlarged and swollen.     Left Turbinates: Enlarged and swollen.     Right Sinus: No maxillary sinus tenderness or frontal sinus tenderness.     Left Sinus: No maxillary sinus tenderness or frontal sinus tenderness.     Mouth/Throat:     Lips: Pink.     Pharynx: Oropharynx is clear.  Cardiovascular:     Rate and Rhythm: Regular rhythm. Tachycardia present.  Pulmonary:     Effort: Pulmonary effort is normal.     Breath sounds: No decreased air movement. Examination of the left-middle field reveals wheezing. Examination of the left-lower field reveals wheezing. Wheezing present.     Comments: Slight wheeze noted Musculoskeletal:        General: Normal range of motion.     Cervical back: Normal range of motion and neck supple.  Skin:    General: Skin is warm and dry.  Neurological:     General: No focal deficit present.     Mental Status: He is oriented to person, place, and time.  Psychiatric:        Mood and Affect: Mood normal.        Behavior: Behavior normal.        Thought Content: Thought content normal.        Judgment: Judgment normal.         Assessment & Plan:   Problem List Items Addressed This Visit   None Visit Diagnoses     Acute bacterial bronchitis    -  Primary   Relevant Medications   azithromycin (ZITHROMAX) 250 MG tablet   predniSONE (DELTASONE) 20 MG tablet   Other Relevant Orders   COVID-19, Flu A+B and RSV   Tachycardia         1. Acute bacterial bronchitis Trial azithromycin, prednisone taper.  Patient education given on supportive care, red flags given for prompt reevaluation - COVID-19, Flu A+B and RSV -  azithromycin (ZITHROMAX) 250 MG tablet; Take 2 tabs PO day 1, then take 1 tab PO once daily  Dispense: 6 tablet; Refill: 0 - predniSONE (DELTASONE) 20 MG tablet; Take 3 tablets (60 mg total) by mouth daily with breakfast for 2 days, THEN 2 tablets (40 mg total) daily with breakfast for 2 days, THEN 1 tablet (20 mg total) daily with breakfast for 2 days, THEN 0.5 tablets (10 mg total) daily with breakfast for 2 days.  Dispense: 13  tablet; Refill: 0  2. Tachycardia Patient encouraged to return to mobile unit in 2 weeks for further evaluation.   I have reviewed the patient's medical history (PMH, PSH, Social History, Family History, Medications, and allergies) , and have been updated if relevant. I spent 30 minutes reviewing chart and  face to face time with patient.     Return in about 2 weeks (around 01/05/2022) for with [facility].   Kasandra Knudsen Mayers, PA-C

## 2022-06-13 ENCOUNTER — Other Ambulatory Visit: Payer: Self-pay | Admitting: Physician Assistant

## 2022-06-13 DIAGNOSIS — B9689 Other specified bacterial agents as the cause of diseases classified elsewhere: Secondary | ICD-10-CM
